# Patient Record
Sex: Male | Born: 1960 | Race: Black or African American | Hispanic: No | Marital: Married | Smoking: Former smoker
Health system: Southern US, Community
[De-identification: ages and names within clinical notes are randomized; demographics above are authoritative.]

## PROBLEM LIST (undated history)

## (undated) DIAGNOSIS — I10 Essential (primary) hypertension: Secondary | ICD-10-CM

## (undated) DIAGNOSIS — M109 Gout, unspecified: Secondary | ICD-10-CM

## (undated) DIAGNOSIS — B1920 Unspecified viral hepatitis C without hepatic coma: Secondary | ICD-10-CM

## (undated) HISTORY — PX: JOINT REPLACEMENT: SHX530

---

## 1998-04-15 ENCOUNTER — Emergency Department (HOSPITAL_COMMUNITY): Admission: EM | Admit: 1998-04-15 | Discharge: 1998-04-15 | Payer: Self-pay | Admitting: Emergency Medicine

## 1998-04-15 ENCOUNTER — Encounter: Payer: Self-pay | Admitting: Emergency Medicine

## 2004-01-22 ENCOUNTER — Ambulatory Visit (HOSPITAL_COMMUNITY): Admission: RE | Admit: 2004-01-22 | Discharge: 2004-01-22 | Payer: Self-pay | Admitting: Gastroenterology

## 2006-03-16 IMAGING — US US BIOPSY
1 series · 4 of 4 positions shown · non-contrast
Comparison: none

CLINICAL DATA: Hepatitis C

[Series 1: unknown · 0.30mm/px · 4 of 4 slices shown]
[im 1/4]
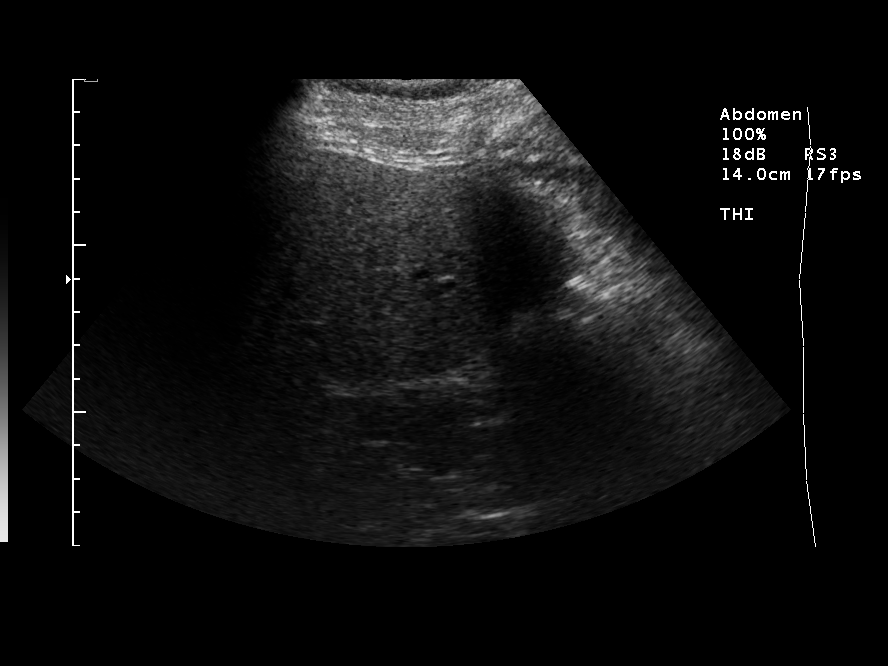
[im 2/4]
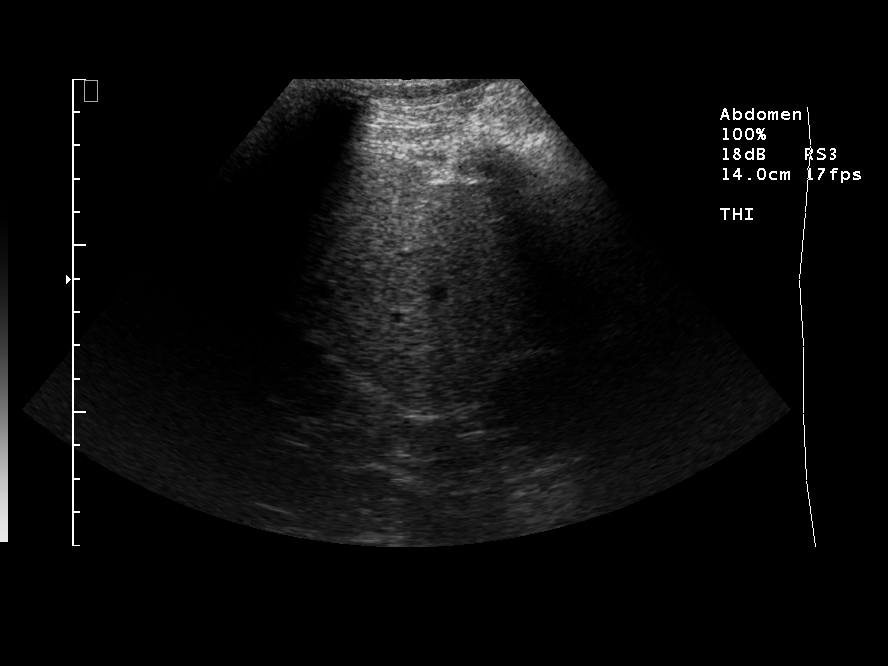
[im 3/4]
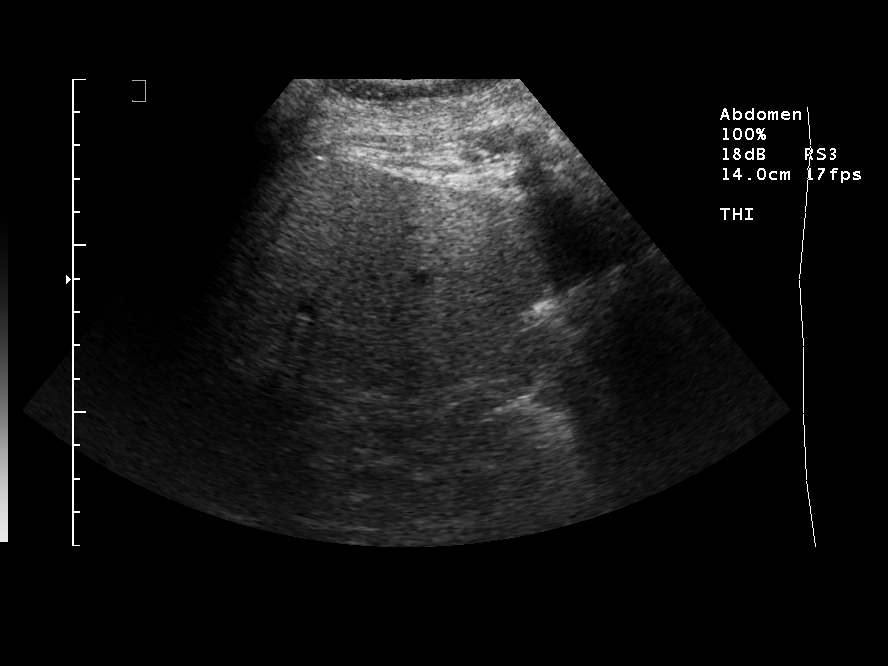
[im 4/4]
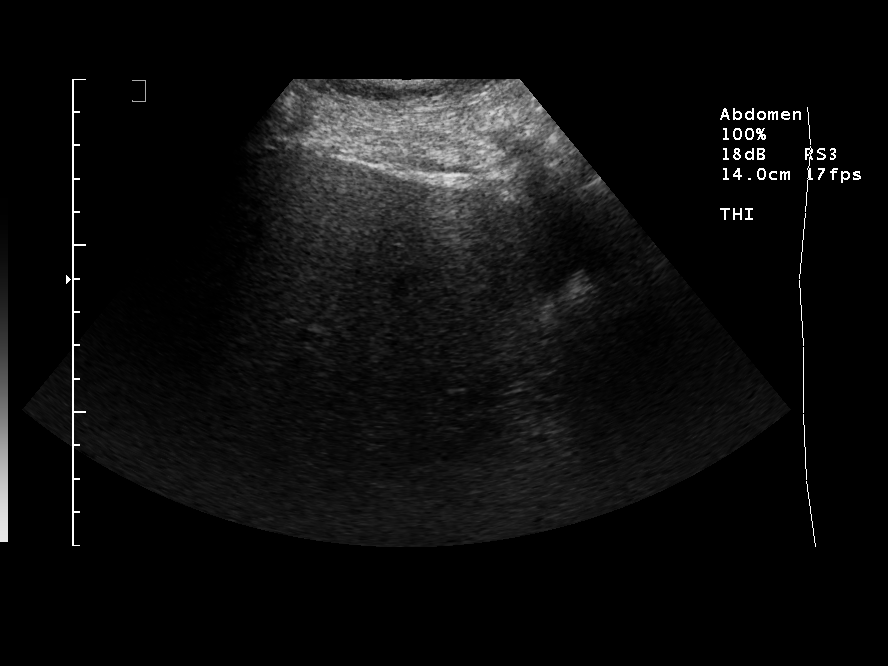

[4 of 4 positions shown; findings below may reference images not displayed]

ULTRASOUND-GUIDED CORE LIVER BIOPSY:

An ultrasound guided liver biopsy was thoroughly discussed with the patient and
questions were answered. The benefits, risks, alternatives, and complications
were also discussed. The patient understands and wishes to proceed with the
procedure. A verbal as well as written consent was obtained.

Survey ultrasound of the liver was performed and an appropriate skin entry site
was determined. Skin site was marked, prepped with Betadine, and draped in usual
sterile fashion, and infiltrated locally with 1% lidocaine. Intravenous fentanyl
and Versed were administered as conscious sedation during continuous
cardiorespiratory monitoring by the radiology RN. A 17 gauge trocar needle was
advanced into the liver.  Four  coaxial 18 gauge core samples were then obtained
through the guide needle. The guide needle was removed. Postprocedure scans
demonstrate no apparent complication.
IMPRESSION: 1. Technically successful ultrasound guided core liver biopsy.

## 2012-01-06 ENCOUNTER — Encounter (HOSPITAL_BASED_OUTPATIENT_CLINIC_OR_DEPARTMENT_OTHER): Payer: Self-pay | Admitting: Emergency Medicine

## 2012-01-06 ENCOUNTER — Emergency Department (HOSPITAL_BASED_OUTPATIENT_CLINIC_OR_DEPARTMENT_OTHER)
Admission: EM | Admit: 2012-01-06 | Discharge: 2012-01-06 | Disposition: A | Discharge status: Other Acute Inpatient Hospital | Attending: Emergency Medicine | Admitting: Emergency Medicine

## 2012-01-06 DIAGNOSIS — Z79899 Other long term (current) drug therapy: Secondary | ICD-10-CM | POA: Insufficient documentation

## 2012-01-06 DIAGNOSIS — I10 Essential (primary) hypertension: Secondary | ICD-10-CM | POA: Insufficient documentation

## 2012-01-06 DIAGNOSIS — M109 Gout, unspecified: Secondary | ICD-10-CM | POA: Insufficient documentation

## 2012-01-06 DIAGNOSIS — T783XXA Angioneurotic edema, initial encounter: Secondary | ICD-10-CM | POA: Insufficient documentation

## 2012-01-06 DIAGNOSIS — T4995XA Adverse effect of unspecified topical agent, initial encounter: Secondary | ICD-10-CM | POA: Insufficient documentation

## 2012-01-06 HISTORY — DX: Essential (primary) hypertension: I10

## 2012-01-06 HISTORY — DX: Gout, unspecified: M10.9

## 2012-01-06 LAB — CBC WITH DIFFERENTIAL/PLATELET
Basophils Absolute: 0 10*3/uL (ref 0.0–0.1)
Basophils Relative: 1 % (ref 0–1)
Eosinophils Absolute: 0.1 10*3/uL (ref 0.0–0.7)
Eosinophils Relative: 2 % (ref 0–5)
HCT: 36 % — ABNORMAL LOW (ref 39.0–52.0)
Hemoglobin: 13.5 g/dL (ref 13.0–17.0)
Lymphocytes Relative: 47 % — ABNORMAL HIGH (ref 12–46)
Lymphs Abs: 2.4 10*3/uL (ref 0.7–4.0)
MCH: 33.5 pg (ref 26.0–34.0)
MCHC: 37.5 g/dL — ABNORMAL HIGH (ref 30.0–36.0)
MCV: 89.3 fL (ref 78.0–100.0)
Monocytes Absolute: 0.7 10*3/uL (ref 0.1–1.0)
Monocytes Relative: 13 % — ABNORMAL HIGH (ref 3–12)
Neutro Abs: 2 10*3/uL (ref 1.7–7.7)
Neutrophils Relative %: 39 % — ABNORMAL LOW (ref 43–77)
Platelets: 51 10*3/uL — ABNORMAL LOW (ref 150–400)
RBC: 4.03 MIL/uL — ABNORMAL LOW (ref 4.22–5.81)
RDW: 12.9 % (ref 11.5–15.5)
WBC: 5.2 10*3/uL (ref 4.0–10.5)

## 2012-01-06 LAB — BASIC METABOLIC PANEL
BUN: 13 mg/dL (ref 6–23)
Calcium: 9.2 mg/dL (ref 8.4–10.5)
Chloride: 97 mEq/L (ref 96–112)
Creatinine, Ser: 0.9 mg/dL (ref 0.50–1.35)
GFR calc Af Amer: >90 mL/min (ref >90)

## 2012-01-06 MED ORDER — SODIUM CHLORIDE 0.9 % IV BOLUS (SEPSIS)
1000.0000 mL | Freq: Once | INTRAVENOUS | Status: AC
  Administered 2012-01-06: 1000 mL via INTRAVENOUS

## 2012-01-06 MED ORDER — DIPHENHYDRAMINE HCL 50 MG/ML IJ SOLN
50.0000 mg | Freq: Once | INTRAMUSCULAR | Status: AC
  Administered 2012-01-06: 50 mg via INTRAVENOUS

## 2012-01-06 MED ORDER — METHYLPREDNISOLONE SODIUM SUCC 125 MG IJ SOLR
INTRAMUSCULAR | Status: AC
  Administered 2012-01-06: 125 mg
  Filled 2012-01-06: qty 2

## 2012-01-06 MED ORDER — DEXAMETHASONE SODIUM PHOSPHATE 4 MG/ML IJ SOLN: 4.0000 mg | Freq: Once | INTRAMUSCULAR | Status: DC

## 2012-01-06 MED ORDER — FAMOTIDINE IN NACL 20-0.9 MG/50ML-% IV SOLN
20.0000 mg | Freq: Once | INTRAVENOUS | Status: AC
  Administered 2012-01-06: 20 mg via INTRAVENOUS

## 2012-01-06 MED ORDER — FAMOTIDINE IN NACL 20-0.9 MG/50ML-% IV SOLN
INTRAVENOUS | Status: AC
  Administered 2012-01-06: 20 mg via INTRAVENOUS
  Filled 2012-01-06: qty 50

## 2012-01-06 MED ORDER — DIPHENHYDRAMINE HCL 50 MG/ML IJ SOLN
INTRAMUSCULAR | Status: AC
  Administered 2012-01-06: 50 mg via INTRAVENOUS
  Filled 2012-01-06: qty 1

## 2012-01-06 NOTE — ED Provider Notes (Signed)
History   This chart was scribed for Richardean Canal, MD scribed by Magnus Sinning. The patient was seen in room MHT14/M[facility]14 at 16:25  CSN: [id number]  Arrival date & time 01/06/12  1622     Chief Complaint  Patient presents with  . Oral Swelling    (Consider location/radiation/quality/duration/timing/severity/associated sxs/prior treatment) The history is provided by the patient. No language interpreter was used.   KEMAL AMORES is a 51 y.o. male who presents to the Emergency Department complaining of  moderate sudden onset tongue swelling onset this afternoon. The patient denies any mouth swelling, drooling,pain, difficulty breathing or allergies. He says that he is on lisinopril for treatment of HTN and that he has been on this medication for many years.  Past Medical History  Diagnosis Date  . Hypertension   . Gout     No past surgical history on file.  No family history on file.  History  Substance Use Topics  . Smoking status: Not on file  . Smokeless tobacco: Not on file  . Alcohol Use:       Review of Systems  HENT: Negative for drooling.        Tongue swelling  Respiratory: Negative for shortness of breath.   All other systems reviewed and are negative.    Allergies  Review of patient's allergies indicates no known allergies.  Home Medications   Current Outpatient Rx  Name  Route  Sig  Dispense  Refill  . ALLOPURINOL 100 MG PO TABS   Oral   Take 100 mg by mouth daily.         Marland Kitchen LISINOPRIL 10 MG PO TABS   Oral   Take 10 mg by mouth daily.           BP 139/89  Pulse 80  Temp 98.5 F (36.9 C) (Oral)  Resp 21  Ht 6\' 2"  (1.88 m)  Wt 210 lb (95.255 kg)  BMI 26.96 kg/m2  SpO2 99%  Physical Exam  Nursing note and vitals reviewed. Constitutional: He is oriented to person, place, and time. He appears well-developed and well-nourished. No distress.  HENT:  Head: Normocephalic and atraumatic.  Mouth/Throat: Oropharynx is clear and moist.      Left side of tongue with obvious swelling. No obvious soft palate swelling and uvula is midline with no swelling.  Eyes: Conjunctivae normal and EOM are normal.  Neck: Neck supple. No tracheal deviation present.  Cardiovascular: Normal rate, regular rhythm and normal heart sounds.   No murmur heard. Pulmonary/Chest: Effort normal and breath sounds normal. No respiratory distress. He has no wheezes. He has no rales.       Lungs clear to ausculation. No stridor  Abdominal: He exhibits no distension.  Musculoskeletal: Normal range of motion.  Neurological: He is alert and oriented to person, place, and time. No sensory deficit.  Skin: Skin is warm and dry.  Psychiatric: He has a normal mood and affect. His behavior is normal.    ED Course  Procedures (including critical care time) DIAGNOSTIC STUDIES: Oxygen Saturation is 99% on room air, normal by my interpretation.    CRITICAL CARE Performed by: Silverio Lay, Reginald Weida   Total critical care time: 30 min   Critical care time was exclusive of separately billable procedures and treating other patients.  Critical care was necessary to treat or prevent imminent or life-threatening deterioration.  Critical care was time spent personally by me on the following activities: development of treatment plan with patient and/or  surrogate as well as nursing, discussions with consultants, evaluation of patient's response to treatment, examination of patient, obtaining history from patient or surrogate, ordering and performing treatments and interventions, ordering and review of laboratory studies, ordering and review of radiographic studies, pulse oximetry and re-evaluation of patient's condition.   COORDINATION OF CARE: Labs Reviewed  CBC WITH DIFFERENTIAL - Abnormal; Notable for the following:    RBC 4.03 (*)     HCT 36.0 (*)     MCHC 37.5 (*)  RULED OUT INTERFERING SUBSTANCES   Platelets 51 (*)  PLATELET COUNT CONFIRMED BY SMEAR   Neutrophils Relative  39 (*)     Lymphocytes Relative 47 (*)     Monocytes Relative 13 (*)     All other components within normal limits  BASIC METABOLIC PANEL - Abnormal; Notable for the following:    Sodium 131 (*)     Glucose, Bld 113 (*)     All other components within normal limits   No results found.   No diagnosis found.    MDM  CAYLE THUNDER is a 51 y.o. male here with anioedema likely from lisinopril. I gave him solumedrol, benadryl, pepcid and will reassess patient.   5:30 PM Patient's tongue a little more swollen. Has some drooling when asleep. But otherwise protecting airway.   6:20 PM The tongue is swollen. Still protecting airway, will need admission.   7:10PM  I discussed with hospitalist at [facility], Dr. Sharyn Blitz, who accepted the patient.   I personally performed the services described in this documentation, which was scribed in my presence. The recorded information has been reviewed and is accurate.     Richardean Canal, MD 01/06/12 [phone]

## 2012-01-06 NOTE — ED Notes (Signed)
Pt states he had sudden onset tongue swelling prior to arrival.  No airway impairment, no rash.

## 2013-01-02 ENCOUNTER — Emergency Department (HOSPITAL_BASED_OUTPATIENT_CLINIC_OR_DEPARTMENT_OTHER)
Admission: EM | Admit: 2013-01-02 | Discharge: 2013-01-02 | Disposition: A | Discharge status: Home / Self Care | Payer: Federal, State, Local not specified - PPO | Attending: Emergency Medicine | Admitting: Emergency Medicine

## 2013-01-02 ENCOUNTER — Encounter (HOSPITAL_BASED_OUTPATIENT_CLINIC_OR_DEPARTMENT_OTHER): Payer: Self-pay | Admitting: Emergency Medicine

## 2013-01-02 DIAGNOSIS — B182 Chronic viral hepatitis C: Secondary | ICD-10-CM | POA: Insufficient documentation

## 2013-01-02 DIAGNOSIS — R630 Anorexia: Secondary | ICD-10-CM | POA: Insufficient documentation

## 2013-01-02 DIAGNOSIS — R609 Edema, unspecified: Secondary | ICD-10-CM | POA: Insufficient documentation

## 2013-01-02 DIAGNOSIS — I10 Essential (primary) hypertension: Secondary | ICD-10-CM | POA: Insufficient documentation

## 2013-01-02 DIAGNOSIS — R635 Abnormal weight gain: Secondary | ICD-10-CM | POA: Insufficient documentation

## 2013-01-02 DIAGNOSIS — E871 Hypo-osmolality and hyponatremia: Secondary | ICD-10-CM | POA: Insufficient documentation

## 2013-01-02 DIAGNOSIS — K7040 Alcoholic hepatic failure without coma: Secondary | ICD-10-CM

## 2013-01-02 DIAGNOSIS — F17200 Nicotine dependence, unspecified, uncomplicated: Secondary | ICD-10-CM | POA: Insufficient documentation

## 2013-01-02 DIAGNOSIS — R600 Localized edema: Secondary | ICD-10-CM

## 2013-01-02 DIAGNOSIS — M109 Gout, unspecified: Secondary | ICD-10-CM | POA: Insufficient documentation

## 2013-01-02 DIAGNOSIS — E876 Hypokalemia: Secondary | ICD-10-CM | POA: Insufficient documentation

## 2013-01-02 DIAGNOSIS — R17 Unspecified jaundice: Secondary | ICD-10-CM | POA: Insufficient documentation

## 2013-01-02 DIAGNOSIS — R7989 Other specified abnormal findings of blood chemistry: Secondary | ICD-10-CM | POA: Insufficient documentation

## 2013-01-02 DIAGNOSIS — R188 Other ascites: Secondary | ICD-10-CM | POA: Insufficient documentation

## 2013-01-02 DIAGNOSIS — K709 Alcoholic liver disease, unspecified: Secondary | ICD-10-CM | POA: Insufficient documentation

## 2013-01-02 DIAGNOSIS — Z79899 Other long term (current) drug therapy: Secondary | ICD-10-CM | POA: Insufficient documentation

## 2013-01-02 HISTORY — DX: Unspecified viral hepatitis C without hepatic coma: B19.20

## 2013-01-02 LAB — COMPREHENSIVE METABOLIC PANEL
BUN: 8 mg/dL (ref 6–23)
Calcium: 8.1 mg/dL — ABNORMAL LOW (ref 8.4–10.5)
Creatinine, Ser: 0.9 mg/dL (ref 0.50–1.35)
GFR calc Af Amer: >90 mL/min (ref >90)
Glucose, Bld: 105 mg/dL — ABNORMAL HIGH (ref 70–99)
Total Protein: 8.4 g/dL — ABNORMAL HIGH (ref 6.0–8.3)

## 2013-01-02 LAB — CBC WITH DIFFERENTIAL/PLATELET
Basophils Absolute: 0 10*3/uL (ref 0.0–0.1)
Eosinophils Absolute: 0.2 10*3/uL (ref 0.0–0.7)
HCT: 35.9 % — ABNORMAL LOW (ref 39.0–52.0)
Lymphs Abs: 2 10*3/uL (ref 0.7–4.0)
MCHC: 36.8 g/dL — ABNORMAL HIGH (ref 30.0–36.0)
MCV: 94.7 fL (ref 78.0–100.0)
Neutro Abs: 1.7 10*3/uL (ref 1.7–7.7)
Platelets: 80 10*3/uL — ABNORMAL LOW (ref 150–400)
RDW: 14 % (ref 11.5–15.5)

## 2013-01-02 LAB — PROTIME-INR
INR: 1.54 — ABNORMAL HIGH (ref 0.00–1.49)
Prothrombin Time: 18.1 seconds — ABNORMAL HIGH (ref 11.6–15.2)

## 2013-01-02 LAB — PRO B NATRIURETIC PEPTIDE: Pro B Natriuretic peptide (BNP): 28.7 pg/mL (ref 0–125)

## 2013-01-02 MED ORDER — LORAZEPAM 0.5 MG PO TABS: 0.5000 mg | ORAL_TABLET | Freq: Three times a day (TID) | ORAL | Status: AC | PRN

## 2013-01-02 MED ORDER — POTASSIUM CHLORIDE CRYS ER 20 MEQ PO TBCR
40.0000 meq | EXTENDED_RELEASE_TABLET | Freq: Once | ORAL | Status: AC
  Administered 2013-01-02: 40 meq via ORAL
  Filled 2013-01-02: qty 2

## 2013-01-02 MED ORDER — POTASSIUM CHLORIDE CRYS ER 20 MEQ PO TBCR: 20.0000 meq | EXTENDED_RELEASE_TABLET | Freq: Every day | ORAL | Status: AC

## 2013-01-02 NOTE — ED Notes (Signed)
Pt has distended abdomen states he drinks daily and has hepatitis C also has some swelling in right lower leg

## 2013-01-02 NOTE — ED Notes (Signed)
MD at bedside.using portable ultrasound on right leg.

## 2013-01-02 NOTE — ED Notes (Addendum)
Pt c/o feet, ankle, leg and abdomen swelling x 1-2 wks. Pt denies pain, shob. [facility] [place]

## 2013-01-02 NOTE — ED Provider Notes (Signed)
CSN: [id number]     Arrival date & time 01/02/13  0805 History   First MD Initiated Contact with Patient 01/02/13 [phone]     Chief Complaint  Patient presents with  . Leg Swelling   (Consider location/radiation/quality/duration/timing/severity/associated sxs/prior Treatment) HPI Comments: 52 yo male with heavy etoh hx (daily), smoker, hep C presents with leg swelling bilateral worse on the left for the past week, 10 lbs wt gain and abdo distension.  Mild tension lower leg.  No abdo pain or fevers.  No current treatment for hep C, followed in [place].  Pt feels okay otherwise. No kidney or heart issues.  No new meds.   The history is provided by the patient.    Past Medical History  Diagnosis Date  . Hypertension   . Gout   . Hepatitis C    History reviewed. No pertinent past surgical history. No family history on file. History  Substance Use Topics  . Smoking status: Current Every Day Smoker  . Smokeless tobacco: Current User  . Alcohol Use: 3.6 oz/week    2 Cans of beer, 4 Shots of liquor per week     Comment: daily    Review of Systems  Constitutional: Positive for appetite change and unexpected weight change. Negative for fever and chills.  HENT: Negative for congestion.   Eyes: Negative for visual disturbance.  Respiratory: Negative for shortness of breath.   Cardiovascular: Positive for leg swelling (worse left). Negative for chest pain.  Gastrointestinal: Negative for vomiting and abdominal pain.  Genitourinary: Negative for dysuria and flank pain.  Musculoskeletal: Negative for back pain, neck pain and neck stiffness.  Skin: Negative for rash.  Neurological: Negative for light-headedness and headaches.    Allergies  Lisinopril  Home Medications   Current Outpatient Rx  Name  Route  Sig  Dispense  Refill  . allopurinol (ZYLOPRIM) 100 MG tablet   Oral   Take 100 mg by mouth daily.         Marland Kitchen lisinopril (PRINIVIL,ZESTRIL) 10 MG tablet   Oral   Take 10 mg by  mouth daily.          There were no vitals taken for this visit. Physical Exam  Nursing note and vitals reviewed. Constitutional: He is oriented to person, place, and time. He appears well-developed and well-nourished.  HENT:  Head: Normocephalic and atraumatic.  Eyes: Conjunctivae are normal. Right eye exhibits no discharge. Left eye exhibits no discharge. Scleral icterus (mild) is present.  Neck: Normal range of motion. Neck supple. No tracheal deviation present.  Cardiovascular: Normal rate and regular rhythm.   Pulmonary/Chest: Effort normal and breath sounds normal.  Abdominal: Soft. He exhibits distension. There is no tenderness. There is no guarding.  Musculoskeletal: He exhibits edema. He exhibits no tenderness.  Neurological: He is alert and oriented to person, place, and time. No cranial nerve deficit.  Skin: Skin is warm. No rash noted.  2+ edema bilateral LE worse on the left, pitting, soft compartments  Psychiatric: He has a normal mood and affect.    ED Course  Procedures (including critical care time) Ultrasound limited abdominal and limited transthoracic ultrasound (FAST)  Indication: abdo distension, weight gain, concern for ascites Four views were obtained using the low frequency transducer: Splenorenal, Hepatorenal, Retrovesical, Pericardial subxyphoid Interpretation: Mild- moderate free fluid visualized surrounding the right liver/ kidney, non on left renal, pelvis or pericardium. Images archived electronically Dr. Jodi Mourning personally performed and interpreted the images Emergency Ultrasound Study:  Limited Duplex  of left lower extremity veins  Performed by Dr. Jodi Mourning Indication: leg pain and/or swelling left LE Visualization of saphenous-femoral junction, proximal femoral vein and popliteal vein regions in transverse plane with full compression visualized.  Interpretation  no dvt visualized.  Images archived electronically.     Labs Review Labs Reviewed   CBC WITH DIFFERENTIAL - Abnormal; Notable for the following:    RBC 3.79 (*)    HCT 35.9 (*)    MCH 34.8 (*)    MCHC 36.8 (*)    Platelets 80 (*)    Neutrophils Relative % 38 (*)    Monocytes Relative 16 (*)    All other components within normal limits  COMPREHENSIVE METABOLIC PANEL - Abnormal; Notable for the following:    Sodium 133 (*)    Potassium 3.2 (*)    Glucose, Bld 105 (*)    Calcium 8.1 (*)    Total Protein 8.4 (*)    Albumin 1.9 (*)    AST 331 (*)    ALT 108 (*)    Alkaline Phosphatase 133 (*)    Total Bilirubin 2.3 (*)    All other components within normal limits  PROTIME-INR - Abnormal; Notable for the following:    Prothrombin Time 18.1 (*)    INR 1.54 (*)    All other components within normal limits  PRO B NATRIURETIC PEPTIDE   Imaging Review No results found.  EKG Interpretation   None       MDM   1. Alcoholic liver failure   2. LFT elevation   3. Ascites   4. Hep C w/o coma, chronic   5. Hypokalemia   6. Hyponatremia   7. Leg edema    Leg edema worse on the left, pt denies classic blood clot risks (Patient denies blood clot history, active cancer, recent major trauma or surgery, recent long travel, hemoptysis or oral contraceptives).  Since worse on left bedside 2 pt US done, full compression, no signs of significant DVT. No signs of infection.  Pt does not want to quit ETOH at this time. Abdo distension and etoh/ hep C concern  For ascites.  No pain or fevers. Bedside US showed mild ascites RUQ.  Discussed admission with patient adn Dr Macky Lower at [facility], patient does not want to come into hospital and Dr Macky Lower agrees that it can be done outpatient.  Strict reasons to return given, discussed risks of alcoholic liver disease.[facility] fup given if unable to see [facility]. Son in the room withpatient.   PO K given. No bleeding or confusion in ED, no signs of SBP in ED.  Labs ordered.   Discussed alcohol  cessation in detail and close fup with GI  Results and differential diagnosis were discussed with the patient. Close follow up outpatient was discussed, patient comfortable with the plan.          Enid Skeens, MD 01/02/13 [phone]

## 2013-02-07 ENCOUNTER — Encounter (HOSPITAL_BASED_OUTPATIENT_CLINIC_OR_DEPARTMENT_OTHER): Payer: Self-pay | Admitting: Emergency Medicine

## 2013-02-07 ENCOUNTER — Emergency Department (HOSPITAL_BASED_OUTPATIENT_CLINIC_OR_DEPARTMENT_OTHER): Payer: Federal, State, Local not specified - PPO

## 2013-02-07 ENCOUNTER — Emergency Department (HOSPITAL_BASED_OUTPATIENT_CLINIC_OR_DEPARTMENT_OTHER)
Admission: EM | Admit: 2013-02-07 | Discharge: 2013-02-07 | Disposition: A | Discharge status: Home / Self Care | Payer: Federal, State, Local not specified - PPO | Attending: Emergency Medicine | Admitting: Emergency Medicine

## 2013-02-07 DIAGNOSIS — M109 Gout, unspecified: Secondary | ICD-10-CM | POA: Insufficient documentation

## 2013-02-07 DIAGNOSIS — Z79899 Other long term (current) drug therapy: Secondary | ICD-10-CM | POA: Insufficient documentation

## 2013-02-07 DIAGNOSIS — Z8619 Personal history of other infectious and parasitic diseases: Secondary | ICD-10-CM | POA: Insufficient documentation

## 2013-02-07 DIAGNOSIS — M25559 Pain in unspecified hip: Secondary | ICD-10-CM | POA: Insufficient documentation

## 2013-02-07 DIAGNOSIS — Z87891 Personal history of nicotine dependence: Secondary | ICD-10-CM | POA: Insufficient documentation

## 2013-02-07 DIAGNOSIS — I10 Essential (primary) hypertension: Secondary | ICD-10-CM | POA: Insufficient documentation

## 2013-02-07 DIAGNOSIS — G8911 Acute pain due to trauma: Secondary | ICD-10-CM | POA: Insufficient documentation

## 2013-02-07 DIAGNOSIS — Z96649 Presence of unspecified artificial hip joint: Secondary | ICD-10-CM | POA: Insufficient documentation

## 2013-02-07 MED ORDER — HYDROCODONE-ACETAMINOPHEN 5-325 MG PO TABS
1.0000 | ORAL_TABLET | Freq: Once | ORAL | Status: AC
  Administered 2013-02-07: 1 via ORAL

## 2013-02-07 MED ORDER — KETOROLAC TROMETHAMINE 30 MG/ML IJ SOLN
INTRAMUSCULAR | Status: AC
  Administered 2013-02-07: 30 mg via INTRAMUSCULAR
  Filled 2013-02-07: qty 1

## 2013-02-07 MED ORDER — OXYCODONE-ACETAMINOPHEN 5-325 MG PO TABS: 1.0000 | ORAL_TABLET | Freq: Four times a day (QID) | ORAL | Status: AC | PRN

## 2013-02-07 MED ORDER — KETOROLAC TROMETHAMINE 30 MG/ML IJ SOLN
30.0000 mg | Freq: Once | INTRAMUSCULAR | Status: AC
  Administered 2013-02-07: 30 mg via INTRAMUSCULAR

## 2013-02-07 MED ORDER — IBUPROFEN 600 MG PO TABS: 600.0000 mg | ORAL_TABLET | Freq: Four times a day (QID) | ORAL | Status: AC | PRN

## 2013-02-07 MED ORDER — HYDROCODONE-ACETAMINOPHEN 5-325 MG PO TABS
ORAL_TABLET | ORAL | Status: AC
  Administered 2013-02-07: 1 via ORAL
  Filled 2013-02-07: qty 1

## 2013-02-07 NOTE — Discharge Instructions (Signed)
Hip Pain  You need to followup with your orthopedic surgeon. You were given Toradol and Norco for pain management. He will be to home with a short course of pain medication and she take ibuprofen as well. Please return if you have any new or worsening symptoms.  The hips join the upper legs to the lower pelvis. The bones, cartilage, tendons, and muscles of the hip joint perform a lot of work each day holding your body weight and allowing you to move around. Hip pain is a common symptom. It can range from a minor ache to severe pain on 1 or both hips. Pain may be felt on the inside of the hip joint near the groin, or the outside near the buttocks and upper thigh. There may be swelling or stiffness as well. It occurs more often when a person walks or performs activity. There are many reasons hip pain can develop. CAUSES  It is important to work with your caregiver to identify the cause since many conditions can impact the bones, cartilage, muscles, and tendons of the hips. Causes for hip pain include:  Broken (fractured) bones.  Separation of the thighbone from the hip socket (dislocation).  Torn cartilage of the hip joint.  Swelling (inflammation) of a tendon (tendonitis), the sac within the hip joint (bursitis), or a joint.  A weakening in the abdominal wall (hernia), affecting the nerves to the hip.  Arthritis in the hip joint or lining of the hip joint.  Pinched nerves in the back, hip, or upper thigh.  A bulging disc in the spine (herniated disc).  Rarely, bone infection or cancer. DIAGNOSIS  The location of your hip pain will help your caregiver understand what may be causing the pain. A diagnosis is based on your medical history, your symptoms, results from your physical exam, and results from diagnostic tests. Diagnostic tests may include X-ray exams, a computerized magnetic scan (magnetic resonance imaging, MRI), or bone scan. TREATMENT  Treatment will depend on the cause of your  hip pain. Treatment may include:  Limiting activities and resting until symptoms improve.  Crutches or other walking supports (a cane or brace).  Ice, elevation, and compression.  Physical therapy or home exercises.  Shoe inserts or special shoes.  Losing weight.  Medications to reduce pain.  Undergoing surgery. HOME CARE INSTRUCTIONS   Only take over-the-counter or prescription medicines for pain, discomfort, or fever as directed by your caregiver.  Put ice on the injured area:  Put ice in a plastic bag.  Place a towel between your skin and the bag.  Leave the ice on for 15-20 minutes at a time, 03-04 times a day.  Keep your leg raised (elevated) when possible to lessen swelling.  Avoid activities that cause pain.  Follow specific exercises as directed by your caregiver.  Sleep with a pillow between your legs on your most comfortable side.  Record how often you have hip pain, the location of the pain, and what it feels like. This information may be helpful to you and your caregiver.  Ask your caregiver about returning to work or sports and whether you should drive.  Follow up with your caregiver for further exams, therapy, or testing as directed. SEEK MEDICAL CARE IF:   Your pain or swelling continues or worsens after 1 week.  You are feeling unwell or have chills.  You have increasing difficulty with walking.  You have a loss of sensation or other new symptoms.  You have questions or  concerns. SEEK IMMEDIATE MEDICAL CARE IF:   You cannot put weight on the affected hip.  You have fallen.  You have a sudden increase in pain and swelling in your hip.  You have a fever. MAKE SURE YOU:   Understand these instructions.  Will watch your condition.  Will get help right away if you are not doing well or get worse. Document Released: 06/22/2009 Document Revised: 03/27/2011 Document Reviewed: 06/22/2009 [facility] Patient Information 2014 [place],  [place].

## 2013-02-07 NOTE — ED Notes (Signed)
Pt had right hip replacement in 2007, states he injured hip again on Wednesday while pushing a car. Pt was seen on Wednesday by [facility] urgent care, given IM toradol and solu-medrol and rx for flexaril. Pt states no relief from meds.

## 2013-02-07 NOTE — ED Notes (Signed)
Pt transported to xr but was unable to straighten leg and lay flat due to pain. Pt brought back to ED room to await orders for pain meds.

## 2013-02-07 NOTE — ED Provider Notes (Signed)
CSN: [id number]     Arrival date & time 02/07/13  0612 History   First MD Initiated Contact with Patient 02/07/13 [phone]     Chief Complaint  Patient presents with  . Hip Pain   (Consider location/radiation/quality/duration/timing/severity/associated sxs/prior Treatment) HPI  This a 53 year old male with a history of her right hip replacement back in 2007 who presents with right hip pain. Patient reports that he was "pushing a car." On Wednesday. Since that time he has had progressively worsening right hip pain. He was seen at urgent care and given Flexeril. He states that is not helping. He is not taking any anti-inflammatories at this time. He denies any other injury. He has been ambulatory but is now walking with a walker because of the pain. The pain is worse with laying flat. Currently his pain is 9/10.  Past Medical History  Diagnosis Date  . Hypertension   . Gout   . Hepatitis C    Past Surgical History  Procedure Laterality Date  . Joint replacement     History reviewed. No pertinent family history. History  Substance Use Topics  . Smoking status: Former Games developermoker  . Smokeless tobacco: Current User  . Alcohol Use: 3.6 oz/week    2 Cans of beer, 4 Shots of liquor per week     Comment: daily    Review of Systems  Constitutional: Negative.   Respiratory: Negative.  Negative for chest tightness and shortness of breath.   Cardiovascular: Negative.  Negative for chest pain.  Gastrointestinal: Negative.   Genitourinary: Negative.  Negative for dysuria.  Musculoskeletal: Negative for back pain.       Right hip pain  Skin: Negative for wound.  Neurological: Negative for headaches.  All other systems reviewed and are negative.    Allergies  Lisinopril  Home Medications   Current Outpatient Rx  Name  Route  Sig  Dispense  Refill  . allopurinol (ZYLOPRIM) 100 MG tablet   Oral   Take 100 mg by mouth daily.         . cyclobenzaprine (FLEXERIL) 10 MG tablet   Oral    Take 10 mg by mouth 3 (three) times daily as needed for muscle spasms.         . furosemide (LASIX) 40 MG tablet   Oral   Take 40 mg by mouth.         Marland Kitchen. LORazepam (ATIVAN) 0.5 MG tablet   Oral   Take 1 tablet (0.5 mg total) by mouth every 8 (eight) hours as needed for anxiety (withdrawal symptoms from alcohol (sweaty, shaky, et Karie Sodacetera)).   8 tablet   0   . magnesium oxide (MAG-OX) 400 MG tablet   Oral   Take 400 mg by mouth 2 (two) times daily.         Marland Kitchen. spironolactone (ALDACTONE) 100 MG tablet   Oral   Take 100 mg by mouth daily.         Marland Kitchen. ibuprofen (ADVIL,MOTRIN) 600 MG tablet   Oral   Take 1 tablet (600 mg total) by mouth every 6 (six) hours as needed.   30 tablet   0   . lisinopril (PRINIVIL,ZESTRIL) 10 MG tablet   Oral   Take 10 mg by mouth daily.         Marland Kitchen. oxyCODONE-acetaminophen (PERCOCET/ROXICET) 5-325 MG per tablet   Oral   Take 1 tablet by mouth every 6 (six) hours as needed for severe pain.   15 tablet  0   . potassium chloride SA (K-DUR,KLOR-CON) 20 MEQ tablet   Oral   Take 1 tablet (20 mEq total) by mouth daily.   3 tablet   0    BP 149/67  Pulse 89  Temp(Src) 98.5 F (36.9 C) (Oral)  Resp 18  SpO2 98% Physical Exam  Nursing note and vitals reviewed. Constitutional: He is oriented to person, place, and time. He appears well-developed and well-nourished. No distress.  HENT:  Head: Normocephalic and atraumatic.  Cardiovascular: Normal rate, regular rhythm and normal heart sounds.   No murmur heard. Pulmonary/Chest: Effort normal and breath sounds normal. No respiratory distress. He has no wheezes.  Abdominal: Soft. There is no tenderness.  Musculoskeletal: He exhibits no edema.  Range of motion of the right hip limited secondary to pain, no tenderness to palpation noted.    Lymphadenopathy:    He has no cervical adenopathy.  Neurological: He is alert and oriented to person, place, and time.  Skin: Skin is warm and dry.   Psychiatric: He has a normal mood and affect.    ED Course  Procedures (including critical care time) Labs Review Labs Reviewed - No data to display Imaging Review Dg Hip Complete Right  02/07/2013   CLINICAL DATA:  Pain  EXAM: RIGHT HIP - COMPLETE 2+ VIEW  COMPARISON:  September 25, 2005  FINDINGS: Standing frontal pelvis as well as standing frontal as well as lateral right hip images were obtained. 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 There is mild heterotopic bone in the lateral hip joint region. On the left, there is moderate narrowing of the hip joint. There is no fracture or dislocation. Prosthesis appears well seated, although there is some subchondral cystic change along the superior aspect of the acetabular region.  IMPRESSION: Prosthesis overall appears well seated, although there is some subchondral cystic change along the superior aspect of the hip joint. 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 No fracture or dislocation.   Electronically Signed   By: Bretta Bang M.D.   On: 02/07/2013 07:46    EKG Interpretation   None       MDM   1. Hip pain    Patient presents with right hip pain. He is nontoxic-appearing on exam and vital signs are within normal limits. Patient has history of hip replacement on that side. Plain films were obtained and only notable for subchondral cystic changes along the hip joint. Patient is ambulatory with a walker to the bathroom. He was given Toradol and Norco for pain management. He reports improvement of his pain. He has multiple appointments today at the [place] and would like to be able to make his appointments. I have encouraged patient to call his orthopedist regarding continued hip pain. He was getting strict return precautions.  After history, exam, and medical workup I feel the patient has been appropriately medically screened and is safe for discharge home. Pertinent diagnoses were discussed with the  patient. Patient was given return precautions.     Shon Baton, MD 02/07/13 [phone]

## 2013-03-16 DEATH — deceased

## 2015-04-02 IMAGING — CR DG HIP COMPLETE 2+V*R*
3 series · 3 of 3 positions shown · non-contrast
Comparison: September 25, 2005

CLINICAL DATA: Pain

EXAM:
RIGHT HIP - COMPLETE 2+ VIEW

[w cross table hip right *]
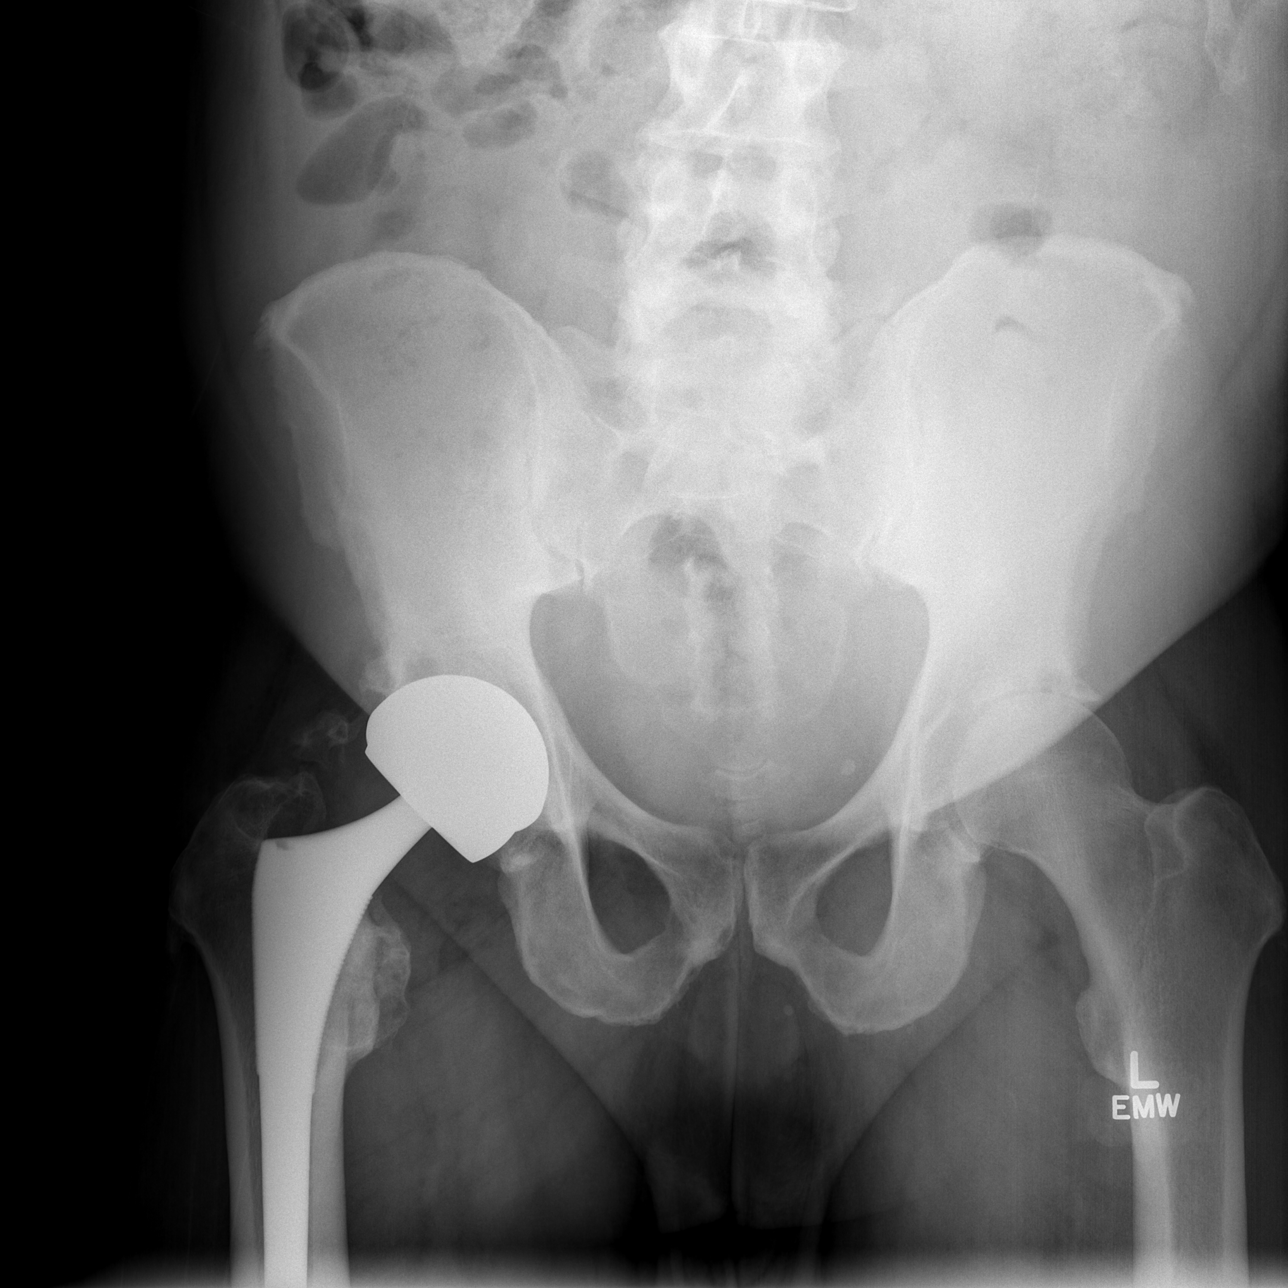

[w cross table hip right]
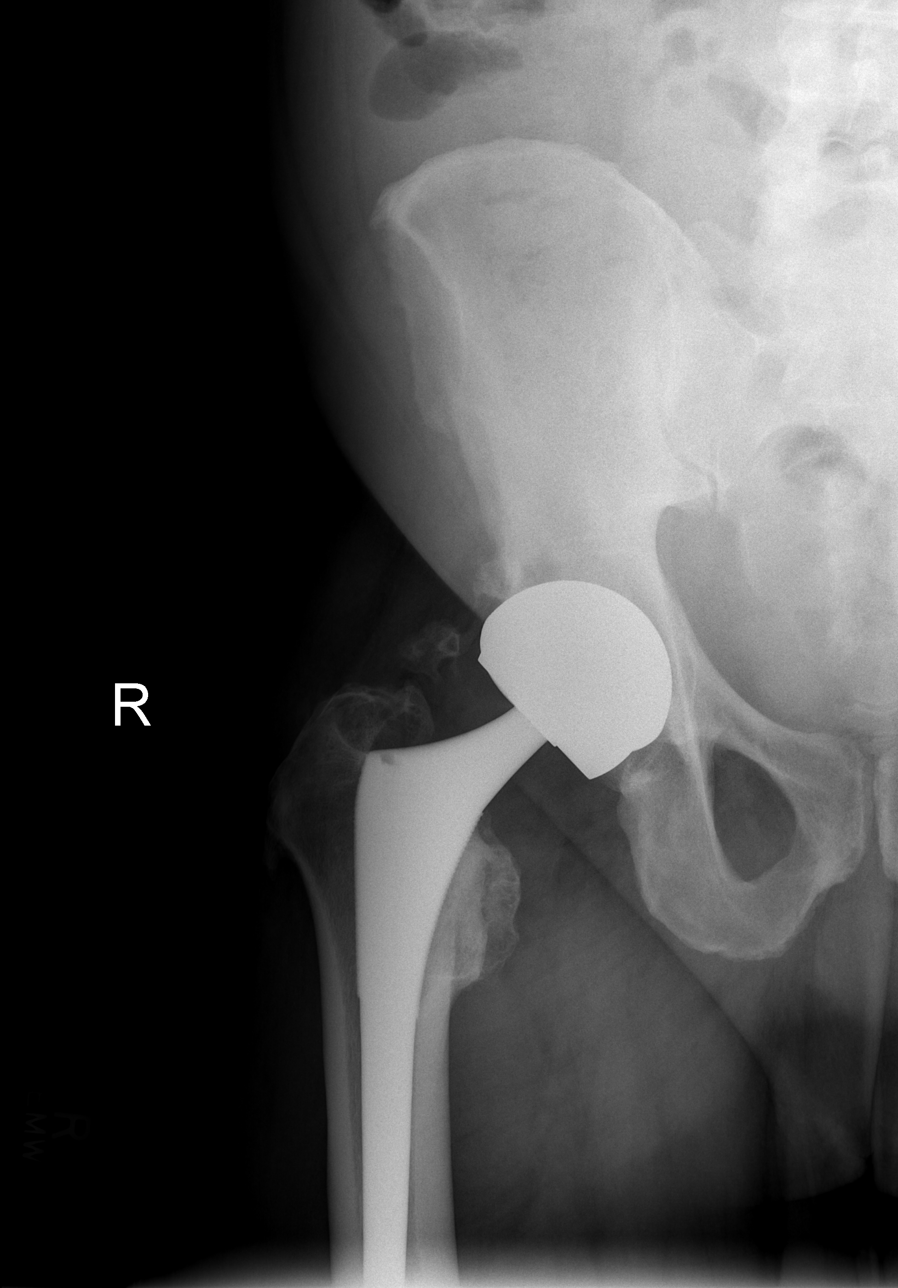

[t hip frog leg right]
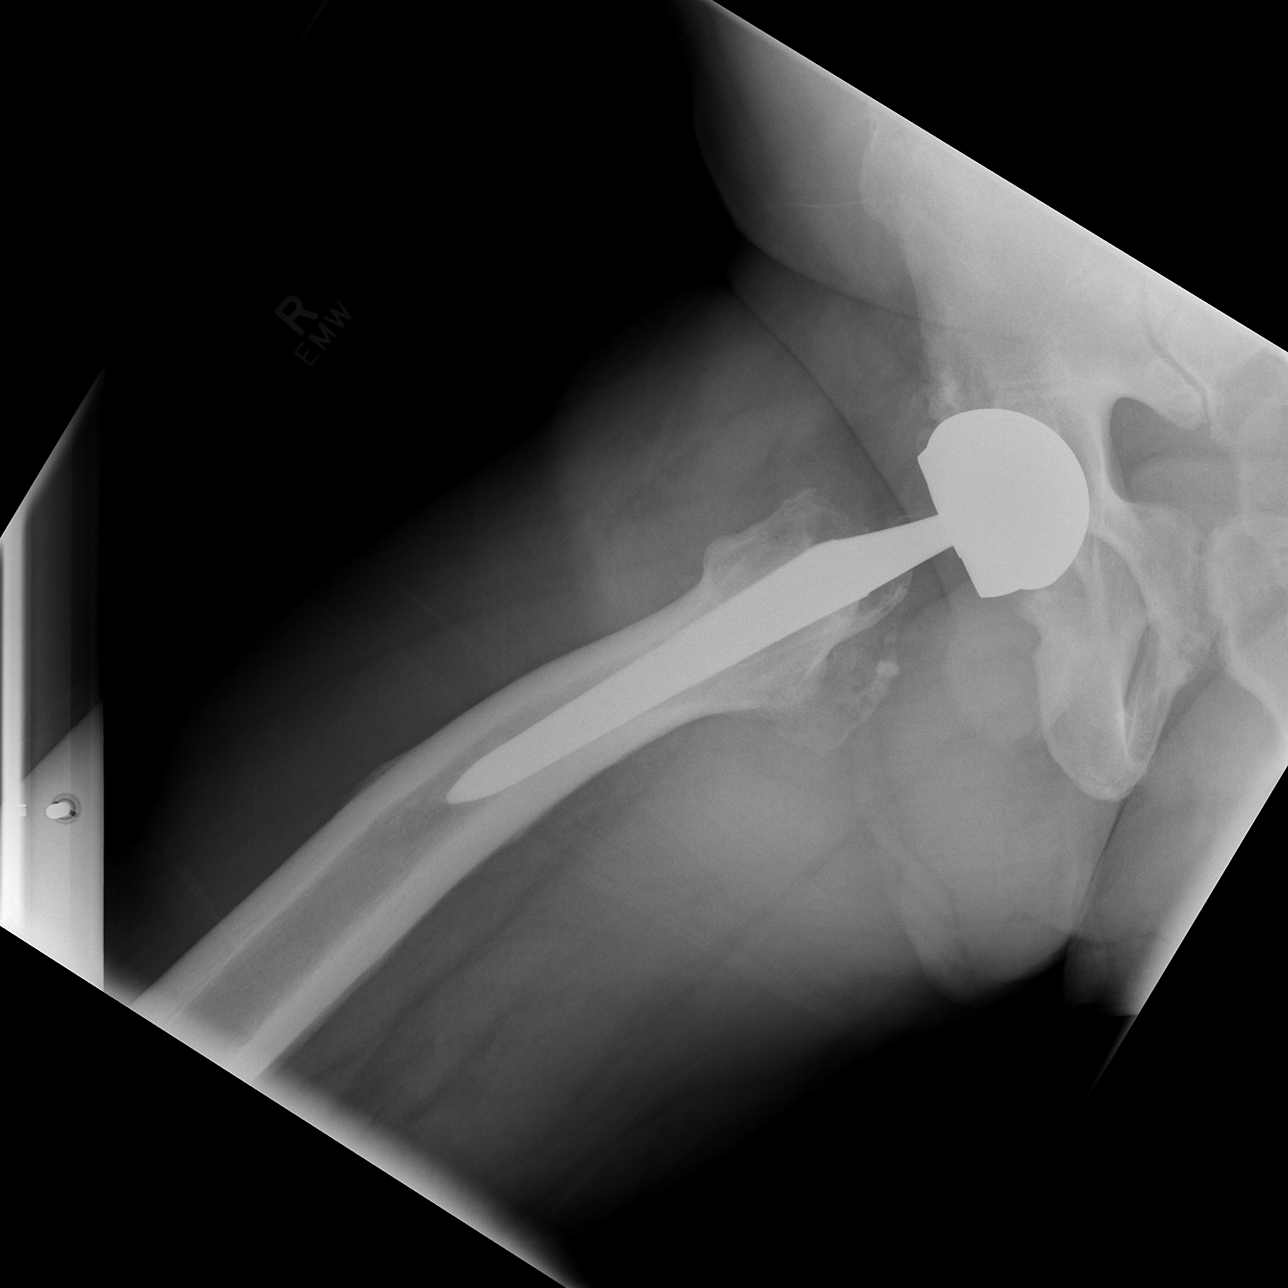

[3 of 3 positions shown; findings below may reference images not displayed]

FINDINGS: Standing frontal pelvis as well as standing frontal as well as
lateral right hip images were obtained. There is a total hip
prosthesis on the right which is well seated. There is mild
heterotopic bone in the lateral hip joint region. On the left, there
is moderate narrowing of the hip joint. There is no fracture or
dislocation. Prosthesis appears well seated, although there is some
subchondral cystic change along the superior aspect of the
acetabular region.
IMPRESSION: Prosthesis overall appears well seated, although there is some
subchondral cystic change along the superior aspect of the hip
joint. Moderate osteoarthritic changes noted on the left. No
fracture or dislocation.
